# Patient Record
Sex: Female | Born: 1985 | Race: Black or African American | Hispanic: No | Marital: Single | Smoking: Current every day smoker
Health system: Southern US, Community
[De-identification: ages and names within clinical notes are randomized; demographics above are authoritative.]

## PROBLEM LIST (undated history)

## (undated) HISTORY — PX: APPENDECTOMY: SHX54

---
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
## 2014-09-07 ENCOUNTER — Emergency Department (HOSPITAL_BASED_OUTPATIENT_CLINIC_OR_DEPARTMENT_OTHER): Payer: Medicaid Other

## 2014-09-07 ENCOUNTER — Emergency Department (HOSPITAL_BASED_OUTPATIENT_CLINIC_OR_DEPARTMENT_OTHER)
Admission: EM | Admit: 2014-09-07 | Discharge: 2014-09-07 | Disposition: A | Discharge status: Home / Self Care | Payer: Medicaid Other | Attending: Emergency Medicine | Admitting: Emergency Medicine

## 2014-09-07 ENCOUNTER — Encounter (HOSPITAL_BASED_OUTPATIENT_CLINIC_OR_DEPARTMENT_OTHER): Payer: Self-pay | Admitting: *Deleted

## 2014-09-07 DIAGNOSIS — Y998 Other external cause status: Secondary | ICD-10-CM | POA: Diagnosis not present

## 2014-09-07 DIAGNOSIS — Z3202 Encounter for pregnancy test, result negative: Secondary | ICD-10-CM | POA: Diagnosis not present

## 2014-09-07 DIAGNOSIS — Z720 Tobacco use: Secondary | ICD-10-CM | POA: Diagnosis not present

## 2014-09-07 DIAGNOSIS — Y9289 Other specified places as the place of occurrence of the external cause: Secondary | ICD-10-CM | POA: Insufficient documentation

## 2014-09-07 DIAGNOSIS — Y9389 Activity, other specified: Secondary | ICD-10-CM | POA: Insufficient documentation

## 2014-09-07 DIAGNOSIS — F8081 Childhood onset fluency disorder: Secondary | ICD-10-CM | POA: Diagnosis not present

## 2014-09-07 DIAGNOSIS — T148 Other injury of unspecified body region: Secondary | ICD-10-CM | POA: Insufficient documentation

## 2014-09-07 DIAGNOSIS — S3992XA Unspecified injury of lower back, initial encounter: Secondary | ICD-10-CM | POA: Diagnosis present

## 2014-09-07 DIAGNOSIS — T07XXXA Unspecified multiple injuries, initial encounter: Secondary | ICD-10-CM

## 2014-09-07 DIAGNOSIS — R52 Pain, unspecified: Secondary | ICD-10-CM

## 2014-09-07 LAB — PREGNANCY, URINE: PREG TEST UR: NEGATIVE

## 2014-09-07 MED ORDER — OXYCODONE-ACETAMINOPHEN 5-325 MG PO TABS: 1.0000 | ORAL_TABLET | Freq: Four times a day (QID) | ORAL | Status: AC | PRN

## 2014-09-07 MED ORDER — METHOCARBAMOL 500 MG PO TABS
500.0000 mg | ORAL_TABLET | Freq: Two times a day (BID) | ORAL | Status: AC
Start: 2014-09-07 — End: ?

## 2014-09-07 MED ORDER — OXYCODONE-ACETAMINOPHEN 5-325 MG PO TABS
ORAL_TABLET | ORAL | Status: DC
Start: 2014-09-07 — End: 2014-09-07
  Filled 2014-09-07: qty 1

## 2014-09-07 MED ORDER — KETOROLAC TROMETHAMINE 60 MG/2ML IM SOLN
60.0000 mg | Freq: Once | INTRAMUSCULAR | Status: AC
  Administered 2014-09-07: 60 mg via INTRAMUSCULAR
  Filled 2014-09-07: qty 2

## 2014-09-07 MED ORDER — OXYCODONE-ACETAMINOPHEN 5-325 MG PO TABS
1.0000 | ORAL_TABLET | Freq: Once | ORAL | Status: AC
  Administered 2014-09-07: 1 via ORAL
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
## 2014-09-07 NOTE — ED Notes (Addendum)
Pt returned from xray
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
## 2014-09-09 ENCOUNTER — Ambulatory Visit: Payer: Medicaid Other | Admitting: Diagnostic Neuroimaging

## 2014-09-10 ENCOUNTER — Encounter: Payer: Self-pay | Admitting: Diagnostic Neuroimaging
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
